# Patient Record
Sex: Male | Born: 1955 | Race: White | Hispanic: Yes | Marital: Married | Smoking: Former smoker
Health system: Southern US, Community
[De-identification: ages and names within clinical notes are randomized; demographics above are authoritative.]

## PROBLEM LIST (undated history)

## (undated) DIAGNOSIS — R569 Unspecified convulsions: Secondary | ICD-10-CM

## (undated) DIAGNOSIS — E119 Type 2 diabetes mellitus without complications: Secondary | ICD-10-CM

---

## 2019-06-05 ENCOUNTER — Encounter: Payer: Self-pay | Admitting: Emergency Medicine

## 2019-06-05 ENCOUNTER — Ambulatory Visit: Payer: Medicare Other

## 2019-06-05 ENCOUNTER — Ambulatory Visit
Admission: EM | Admit: 2019-06-05 | Discharge: 2019-06-05 | Disposition: A | Discharge status: Home / Self Care | Payer: Medicare Other | Attending: Urgent Care | Admitting: Urgent Care

## 2019-06-05 ENCOUNTER — Other Ambulatory Visit: Payer: Self-pay

## 2019-06-05 DIAGNOSIS — Z87891 Personal history of nicotine dependence: Secondary | ICD-10-CM | POA: Diagnosis not present

## 2019-06-05 DIAGNOSIS — R0781 Pleurodynia: Secondary | ICD-10-CM | POA: Insufficient documentation

## 2019-06-05 DIAGNOSIS — M7918 Myalgia, other site: Secondary | ICD-10-CM | POA: Diagnosis present

## 2019-06-05 DIAGNOSIS — Y92410 Unspecified street and highway as the place of occurrence of the external cause: Secondary | ICD-10-CM | POA: Diagnosis not present

## 2019-06-05 DIAGNOSIS — M25511 Pain in right shoulder: Secondary | ICD-10-CM | POA: Insufficient documentation

## 2019-06-05 DIAGNOSIS — R05 Cough: Secondary | ICD-10-CM | POA: Diagnosis not present

## 2019-06-05 HISTORY — DX: Type 2 diabetes mellitus without complications: E11.9

## 2019-06-05 HISTORY — DX: Unspecified convulsions: R56.9

## 2019-06-05 MED ORDER — KETOROLAC TROMETHAMINE 10 MG PO TABS: 10.0000 mg | ORAL_TABLET | Freq: Four times a day (QID) | ORAL | 0 refills | Status: AC | PRN

## 2019-06-05 MED ORDER — METHOCARBAMOL 500 MG PO TABS: 500.0000 mg | ORAL_TABLET | Freq: Two times a day (BID) | ORAL | 0 refills | Status: AC | PRN
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
## 2019-06-05 NOTE — Discharge Instructions (Addendum)
It was very nice seeing you today in clinic. Thank you for entrusting me with your care.   As discussed, your pain seems to be musculoskeletal in nature. Plans for treating you are as follows:  Please utilize the medications that we discussed. Your prescriptions have been called in to your pharmacy.  DO NOT drive when taking the muscle relaxer. Avoid overdoing it, but you need to make efforts to remain active as tolerated.  Avoiding activity all together can make your pain worse. You may find that alternating between ice and moist heat application will help with your pain.  Heat/ice should be applied for 10-15 minutes at a time at least 3-4 times a day.  Make arrangements to follow up with your regular doctor in 1 week for re-evaluation. If your symptoms/condition worsens, please seek follow up care either here or in the ER. Please remember, our [place] providers are "right here with you" when you need [place].   Again, it was my pleasure to take care of you today. Thank you for choosing our clinic. I hope that you start to feel better quickly.   Honor Loh, MSN, APRN, FNP-C, CEN Advanced Practice Provider [facility]
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
